# Patient Record
Sex: Female | Born: 1975 | Race: White | Hispanic: No | Marital: Married | Smoking: Former smoker
Health system: Southern US, Community
[De-identification: ages and names within clinical notes are randomized; demographics above are authoritative.]

## PROBLEM LIST (undated history)

## (undated) DIAGNOSIS — D869 Sarcoidosis, unspecified: Secondary | ICD-10-CM

## (undated) HISTORY — DX: Sarcoidosis, unspecified: D86.9

## (undated) HISTORY — PX: CHOLECYSTECTOMY: SHX55

---
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
## 1998-05-30 ENCOUNTER — Emergency Department (HOSPITAL_COMMUNITY): Admission: EM | Admit: 1998-05-30 | Discharge: 1998-05-30 | Payer: Self-pay | Admitting: Emergency Medicine

## 2004-05-08 ENCOUNTER — Emergency Department (HOSPITAL_COMMUNITY): Admission: EM | Admit: 2004-05-08 | Discharge: 2004-05-08 | Payer: Self-pay | Admitting: Emergency Medicine

## 2006-09-09 ENCOUNTER — Emergency Department (HOSPITAL_COMMUNITY): Admission: EM | Admit: 2006-09-09 | Discharge: 2006-09-09 | Payer: Self-pay | Admitting: Emergency Medicine

## 2007-04-09 ENCOUNTER — Emergency Department (HOSPITAL_COMMUNITY): Admission: EM | Admit: 2007-04-09 | Discharge: 2007-04-09 | Payer: Self-pay | Admitting: Emergency Medicine

## 2007-06-21 ENCOUNTER — Emergency Department (HOSPITAL_COMMUNITY): Admission: EM | Admit: 2007-06-21 | Discharge: 2007-06-21 | Payer: Self-pay | Admitting: Emergency Medicine

## 2007-08-30 ENCOUNTER — Emergency Department (HOSPITAL_COMMUNITY): Admission: EM | Admit: 2007-08-30 | Discharge: 2007-08-30 | Payer: Self-pay | Admitting: Emergency Medicine

## 2007-08-30 ENCOUNTER — Emergency Department (HOSPITAL_COMMUNITY): Admission: EM | Admit: 2007-08-30 | Discharge: 2007-08-31 | Payer: Self-pay | Admitting: Emergency Medicine

## 2007-12-10 ENCOUNTER — Emergency Department (HOSPITAL_COMMUNITY): Admission: EM | Admit: 2007-12-10 | Discharge: 2007-12-10 | Payer: Self-pay | Admitting: Emergency Medicine

## 2009-06-12 ENCOUNTER — Emergency Department (HOSPITAL_BASED_OUTPATIENT_CLINIC_OR_DEPARTMENT_OTHER): Admission: EM | Admit: 2009-06-12 | Discharge: 2009-06-12 | Payer: Self-pay | Admitting: Emergency Medicine

## 2009-06-12 ENCOUNTER — Ambulatory Visit: Payer: Self-pay | Admitting: Diagnostic Radiology

## 2010-01-25 ENCOUNTER — Emergency Department (HOSPITAL_BASED_OUTPATIENT_CLINIC_OR_DEPARTMENT_OTHER)
Admission: EM | Admit: 2010-01-25 | Discharge: 2010-01-25 | Payer: Self-pay | Source: Home / Self Care | Admitting: Emergency Medicine

## 2010-04-11 ENCOUNTER — Emergency Department (HOSPITAL_BASED_OUTPATIENT_CLINIC_OR_DEPARTMENT_OTHER)
Admission: EM | Admit: 2010-04-11 | Discharge: 2010-04-11 | Disposition: A | Discharge status: Home / Self Care | Payer: Medicaid Other | Attending: Emergency Medicine | Admitting: Emergency Medicine

## 2010-04-11 ENCOUNTER — Emergency Department (INDEPENDENT_AMBULATORY_CARE_PROVIDER_SITE_OTHER): Payer: Medicaid Other

## 2010-04-11 DIAGNOSIS — B9789 Other viral agents as the cause of diseases classified elsewhere: Secondary | ICD-10-CM | POA: Insufficient documentation

## 2010-04-11 DIAGNOSIS — R509 Fever, unspecified: Secondary | ICD-10-CM

## 2010-04-11 DIAGNOSIS — R059 Cough, unspecified: Secondary | ICD-10-CM | POA: Insufficient documentation

## 2010-04-11 DIAGNOSIS — F17200 Nicotine dependence, unspecified, uncomplicated: Secondary | ICD-10-CM | POA: Insufficient documentation

## 2010-04-11 DIAGNOSIS — R05 Cough: Secondary | ICD-10-CM | POA: Insufficient documentation

## 2010-04-16 LAB — URINALYSIS, ROUTINE W REFLEX MICROSCOPIC
Bilirubin Urine: NEGATIVE
Glucose, UA: NEGATIVE mg/dL
Ketones, ur: NEGATIVE mg/dL
Protein, ur: NEGATIVE mg/dL
Urobilinogen, UA: 0.2 mg/dL (ref 0.0–1.0)
pH: 6.5 (ref 5.0–8.0)

## 2010-05-07 ENCOUNTER — Emergency Department (HOSPITAL_BASED_OUTPATIENT_CLINIC_OR_DEPARTMENT_OTHER)
Admission: EM | Admit: 2010-05-07 | Discharge: 2010-05-07 | Disposition: A | Discharge status: Home / Self Care | Payer: Medicaid Other | Attending: Emergency Medicine | Admitting: Emergency Medicine

## 2010-05-07 DIAGNOSIS — L03317 Cellulitis of buttock: Secondary | ICD-10-CM | POA: Insufficient documentation

## 2010-05-07 DIAGNOSIS — L0231 Cutaneous abscess of buttock: Secondary | ICD-10-CM | POA: Insufficient documentation

## 2010-06-13 ENCOUNTER — Emergency Department (INDEPENDENT_AMBULATORY_CARE_PROVIDER_SITE_OTHER): Payer: Medicaid Other

## 2010-06-13 ENCOUNTER — Emergency Department (HOSPITAL_BASED_OUTPATIENT_CLINIC_OR_DEPARTMENT_OTHER)
Admission: EM | Admit: 2010-06-13 | Discharge: 2010-06-13 | Disposition: A | Discharge status: Home / Self Care | Payer: Medicaid Other | Attending: Emergency Medicine | Admitting: Emergency Medicine

## 2010-06-13 DIAGNOSIS — M25569 Pain in unspecified knee: Secondary | ICD-10-CM | POA: Insufficient documentation

## 2010-10-31 LAB — CBC
MCHC: 34
MCV: 85.7
Platelets: 257
RBC: 4.93
RDW: 13.6

## 2010-10-31 LAB — DIFFERENTIAL
Basophils Relative: 1
Eosinophils Absolute: 0.1
Eosinophils Relative: 2
Lymphs Abs: 1.8
Monocytes Absolute: 0.6
Monocytes Relative: 9
Neutro Abs: 3.9

## 2010-10-31 LAB — COMPREHENSIVE METABOLIC PANEL
CO2: 26
Chloride: 104
Creatinine, Ser: 0.81
GFR calc Af Amer: >60
GFR calc non Af Amer: >60
Potassium: 4.4
Sodium: 140
Total Bilirubin: 0.7
Total Protein: 7.3

## 2010-10-31 LAB — POCT CARDIAC MARKERS: Troponin i, poc: <0.05

## 2010-11-02 LAB — DIFFERENTIAL
Basophils Absolute: 0.2 — ABNORMAL HIGH
Eosinophils Relative: 2
Lymphocytes Relative: 31
Lymphs Abs: 3.8
Monocytes Relative: 10

## 2010-11-02 LAB — URINALYSIS, ROUTINE W REFLEX MICROSCOPIC
Glucose, UA: NEGATIVE
Nitrite: NEGATIVE
Urobilinogen, UA: 0.2
pH: 5.5

## 2010-11-02 LAB — CBC
HCT: 39.5
Hemoglobin: 13.6
MCHC: 34.6
MCV: 84.8
RBC: 4.65
WBC: 12.1 — ABNORMAL HIGH

## 2010-11-02 LAB — POCT PREGNANCY, URINE
Operator id: 261601
Preg Test, Ur: NEGATIVE

## 2010-11-02 LAB — COMPREHENSIVE METABOLIC PANEL
ALT: 21
Albumin: 3.5
Alkaline Phosphatase: 62
CO2: 25
Creatinine, Ser: 0.77
GFR calc non Af Amer: >60
Glucose, Bld: 90
Sodium: 139
Total Protein: 6.3

## 2010-11-02 LAB — WET PREP, GENITAL: Clue Cells Wet Prep HPF POC: NONE SEEN

## 2010-11-02 LAB — GC/CHLAMYDIA PROBE AMP, GENITAL: Chlamydia, DNA Probe: NEGATIVE

## 2010-11-19 LAB — URINALYSIS, ROUTINE W REFLEX MICROSCOPIC
Bilirubin Urine: NEGATIVE
Glucose, UA: NEGATIVE
Hgb urine dipstick: NEGATIVE
Nitrite: NEGATIVE
Protein, ur: NEGATIVE
Specific Gravity, Urine: 1.017
pH: 6.5

## 2010-11-19 LAB — I-STAT 8, (EC8 V) (CONVERTED LAB)
Bicarbonate: 26.4 — ABNORMAL HIGH
HCT: 43
Hemoglobin: 14.6
Operator id: 146091
Sodium: 138
TCO2: 28
pCO2, Ven: 51.4 — ABNORMAL HIGH

## 2010-11-19 LAB — WET PREP, GENITAL
Trich, Wet Prep: NONE SEEN
Yeast Wet Prep HPF POC: NONE SEEN

## 2010-11-19 LAB — CBC
HCT: 39.9
Platelets: 229
RDW: 12.7
WBC: 6.2

## 2010-11-19 LAB — DIFFERENTIAL
Basophils Absolute: 0
Lymphocytes Relative: 28
Lymphs Abs: 1.7
Neutro Abs: 3.7
Neutrophils Relative %: 59

## 2010-11-19 LAB — RPR: RPR Ser Ql: NONREACTIVE

## 2010-11-19 LAB — POCT I-STAT CREATININE: Creatinine, Ser: 0.9

## 2010-11-19 LAB — GC/CHLAMYDIA PROBE AMP, GENITAL
Chlamydia, DNA Probe: NEGATIVE
GC Probe Amp, Genital: NEGATIVE

## 2011-04-24 ENCOUNTER — Emergency Department (HOSPITAL_BASED_OUTPATIENT_CLINIC_OR_DEPARTMENT_OTHER)
Admission: EM | Admit: 2011-04-24 | Discharge: 2011-04-24 | Disposition: A | Discharge status: Home / Self Care | Payer: Self-pay | Attending: Emergency Medicine | Admitting: Emergency Medicine

## 2011-04-24 ENCOUNTER — Encounter (HOSPITAL_BASED_OUTPATIENT_CLINIC_OR_DEPARTMENT_OTHER): Payer: Self-pay | Admitting: Student

## 2011-04-24 ENCOUNTER — Emergency Department (INDEPENDENT_AMBULATORY_CARE_PROVIDER_SITE_OTHER): Payer: Self-pay

## 2011-04-24 DIAGNOSIS — R079 Chest pain, unspecified: Secondary | ICD-10-CM | POA: Insufficient documentation

## 2011-04-24 DIAGNOSIS — R0602 Shortness of breath: Secondary | ICD-10-CM | POA: Insufficient documentation

## 2011-04-24 DIAGNOSIS — R05 Cough: Secondary | ICD-10-CM | POA: Insufficient documentation

## 2011-04-24 DIAGNOSIS — R059 Cough, unspecified: Secondary | ICD-10-CM | POA: Insufficient documentation

## 2011-04-24 MED ORDER — HYDROCOD POLST-CHLORPHEN POLST 10-8 MG/5ML PO LQCR: 5.0000 mL | Freq: Two times a day (BID) | ORAL | Status: DC | PRN

## 2011-04-24 NOTE — ED Provider Notes (Signed)
History     CSN: [id number]  Arrival date & time 04/24/11  1655   First MD Initiated Contact with Patient 04/24/11 1741      Chief Complaint  Patient presents with  . Cough    (Consider location/radiation/quality/duration/timing/severity/associated sxs/prior treatment) HPI Comments: Pt states that she has had congestion and that her chest is sore generally from so much coughing  Patient is a 36 y.o. female presenting with cough. The history is provided by the patient. No language interpreter was used.  Cough This is a new problem. The current episode started more than 1 week ago. The problem occurs constantly. The problem has not changed since onset.The cough is non-productive. There has been no fever. Pertinent negatives include no chest pain, no ear congestion, no headaches, no myalgias, no shortness of breath and no wheezing. She has tried decongestants and cough syrup for the symptoms. The treatment provided mild relief. She is not a smoker.    No past medical history on file.  Past Surgical History  Procedure Date  . Cholecystectomy     No family history on file.  History  Substance Use Topics  . Smoking status: Never Smoker   . Smokeless tobacco: Not on file  . Alcohol Use: No    OB History    Grav Para Term Preterm Abortions TAB SAB Ect Mult Living                  Review of Systems  Respiratory: Positive for cough. Negative for shortness of breath and wheezing.   Cardiovascular: Negative for chest pain.  Musculoskeletal: Negative for myalgias.  Neurological: Negative for headaches.  All other systems reviewed and are negative.    Allergies  Review of patient's allergies indicates no known allergies.  Home Medications  No current outpatient prescriptions on file.  BP 113/78  Pulse 90  Temp(Src) 98.4 F (36.9 C) (Oral)  Resp 20  Wt 185 lb (83.915 kg)  SpO2 100%  LMP 04/01/2011  Physical Exam  Nursing note and vitals reviewed. Constitutional:  She is oriented to person, place, and time. She appears well-developed and well-nourished.  HENT:  Right Ear: External ear normal.  Left Ear: External ear normal.  Nose: Nose normal.  Mouth/Throat: Oropharynx is clear and moist.  Eyes: Conjunctivae and EOM are normal.  Cardiovascular: Normal rate and regular rhythm.   Pulmonary/Chest: Effort normal. She has rales.  Musculoskeletal: Normal range of motion.  Neurological: She is alert and oriented to person, place, and time.  Skin: Skin is dry.  Psychiatric: She has a normal mood and affect.    ED Course  Procedures (including critical care time)  Labs Reviewed - No data to display Dg Chest 2 View  04/24/2011  *RADIOLOGY REPORT*  Clinical Data: Cough, cold symptoms, chest pain, shortness of breath, former smoker  CHEST - 2 VIEW  Comparison: 04/11/2010  Findings: Normal heart size, mediastinal contours, and pulmonary vascularity. Lungs clear. No pleural effusion or pneumothorax. No acute osseous findings.  IMPRESSION: No acute abnormalities.  Original Report Authenticated By: Lollie Marrow, M.D.     1. Cough       MDM  No acute findings:will treat pt symptomatically        Teressa Lower, NP 04/24/11 1824

## 2011-04-24 NOTE — ED Notes (Addendum)
Pt in with c/o "lung pain" and dry cough since 07 mar.

## 2011-04-24 NOTE — Discharge Instructions (Signed)
Cough, Adult  A cough is a reflex. It helps you clear your throat and airways. A cough can help heal your body. A cough can last 2 or 3 weeks (acute) or may last more than 8 weeks (chronic). Some common causes of a cough can include an infection, allergy, or a cold. HOME CARE  Only take medicine as told by your doctor.   If given, take your medicines (antibiotics) as told. Finish them even if you start to feel better.   Use a cold steam vaporizer or humidier in your home. This can help loosen thick spit (secretions).   Sleep so you are almost sitting up (semi-upright). Use pillows to do this. This helps reduce coughing.   Rest as needed.   Stop smoking if you smoke.  GET HELP RIGHT AWAY IF:  You have yellowish-white fluid (pus) in your thick spit.   Your cough gets worse.   Your medicine does not reduce coughing, and you are losing sleep.   You cough up blood.   You have trouble breathing.   Your pain gets worse and medicine does not help.   You have a fever.  MAKE SURE YOU:   Understand these instructions.   Will watch your condition.   Will get help right away if you are not doing well or get worse.  Document Released: 10/04/2010 Document Revised: 01/10/2011 Document Reviewed: 10/04/2010 [employer] Patient Information 2012 [employer]. 

## 2011-04-25 NOTE — ED Provider Notes (Signed)
Medical screening examination/treatment/procedure(s) were performed by non-physician practitioner and as supervising physician I was immediately available for consultation/collaboration.   Gwyneth Sprout, MD 04/25/11 1544

## 2011-05-14 ENCOUNTER — Encounter: Payer: Self-pay | Admitting: *Deleted

## 2011-11-26 ENCOUNTER — Emergency Department (HOSPITAL_BASED_OUTPATIENT_CLINIC_OR_DEPARTMENT_OTHER): Payer: Self-pay

## 2011-11-26 ENCOUNTER — Emergency Department (HOSPITAL_BASED_OUTPATIENT_CLINIC_OR_DEPARTMENT_OTHER)
Admission: EM | Admit: 2011-11-26 | Discharge: 2011-11-26 | Disposition: A | Discharge status: Home / Self Care | Payer: Self-pay | Attending: Emergency Medicine | Admitting: Emergency Medicine

## 2011-11-26 ENCOUNTER — Encounter (HOSPITAL_BASED_OUTPATIENT_CLINIC_OR_DEPARTMENT_OTHER): Payer: Self-pay | Admitting: *Deleted

## 2011-11-26 ENCOUNTER — Telehealth (HOSPITAL_BASED_OUTPATIENT_CLINIC_OR_DEPARTMENT_OTHER): Payer: Self-pay | Admitting: *Deleted

## 2011-11-26 DIAGNOSIS — R51 Headache: Secondary | ICD-10-CM | POA: Insufficient documentation

## 2011-11-26 DIAGNOSIS — R071 Chest pain on breathing: Secondary | ICD-10-CM | POA: Insufficient documentation

## 2011-11-26 DIAGNOSIS — J189 Pneumonia, unspecified organism: Secondary | ICD-10-CM | POA: Insufficient documentation

## 2011-11-26 DIAGNOSIS — R079 Chest pain, unspecified: Secondary | ICD-10-CM

## 2011-11-26 DIAGNOSIS — Z87891 Personal history of nicotine dependence: Secondary | ICD-10-CM | POA: Insufficient documentation

## 2011-11-26 DIAGNOSIS — Z79899 Other long term (current) drug therapy: Secondary | ICD-10-CM | POA: Insufficient documentation

## 2011-11-26 LAB — COMPREHENSIVE METABOLIC PANEL
Albumin: 3.9 g/dL (ref 3.5–5.2)
Alkaline Phosphatase: 79 U/L (ref 39–117)
BUN: 9 mg/dL (ref 6–23)
Chloride: 103 mEq/L (ref 96–112)
Creatinine, Ser: 0.8 mg/dL (ref 0.50–1.10)
GFR calc Af Amer: >90 mL/min (ref >90)
Glucose, Bld: 84 mg/dL (ref 70–99)
Potassium: 3.3 mEq/L — ABNORMAL LOW (ref 3.5–5.1)
Total Bilirubin: 0.3 mg/dL (ref 0.3–1.2)
Total Protein: 7.6 g/dL (ref 6.0–8.3)

## 2011-11-26 LAB — CBC
HCT: 36.8 % (ref 36.0–46.0)
Hemoglobin: 12.2 g/dL (ref 12.0–15.0)
MCHC: 33.2 g/dL (ref 30.0–36.0)
MCV: 79.1 fL (ref 78.0–100.0)
RDW: 14.3 % (ref 11.5–15.5)

## 2011-11-26 LAB — D-DIMER, QUANTITATIVE: D-Dimer, Quant: 0.57 ug/mL-FEU — ABNORMAL HIGH (ref 0.00–0.48)

## 2011-11-26 LAB — TROPONIN I: Troponin I: <0.3 ng/mL (ref <0.30)

## 2011-11-26 MED ORDER — METOCLOPRAMIDE HCL 5 MG/ML IJ SOLN
10.0000 mg | Freq: Once | INTRAMUSCULAR | Status: AC
  Administered 2011-11-26: 10 mg via INTRAVENOUS
  Filled 2011-11-26: qty 2

## 2011-11-26 MED ORDER — SODIUM CHLORIDE 0.9 % IV BOLUS (SEPSIS)
1000.0000 mL | Freq: Once | INTRAVENOUS | Status: AC
  Administered 2011-11-26: 1000 mL via INTRAVENOUS

## 2011-11-26 MED ORDER — LEVOFLOXACIN 500 MG PO TABS: 500.0000 mg | ORAL_TABLET | Freq: Every day | ORAL | Status: DC

## 2011-11-26 MED ORDER — KETOROLAC TROMETHAMINE 30 MG/ML IJ SOLN
30.0000 mg | Freq: Once | INTRAMUSCULAR | Status: AC
  Administered 2011-11-26: 30 mg via INTRAVENOUS
  Filled 2011-11-26: qty 1

## 2011-11-26 MED ORDER — POTASSIUM CHLORIDE CRYS ER 20 MEQ PO TBCR
40.0000 meq | EXTENDED_RELEASE_TABLET | Freq: Once | ORAL | Status: AC
  Administered 2011-11-26: 40 meq via ORAL
  Filled 2011-11-26: qty 2

## 2011-11-26 MED ORDER — IOHEXOL 350 MG/ML SOLN
80.0000 mL | Freq: Once | INTRAVENOUS | Status: AC | PRN
  Administered 2011-11-26: 80 mL via INTRAVENOUS

## 2011-11-26 NOTE — ED Notes (Signed)
Patient transported to CT 

## 2011-11-26 NOTE — ED Notes (Signed)
Pt c/o a burning sensation in her chest that is worse when she breathes x3 days. Pt also c/o HA x1 day. Pt sts she has had a similar episode apprx. 1 year ago and believes she was dx with pleurisy.

## 2011-11-26 NOTE — ED Notes (Signed)
Patient called to state she can not afford the prescription for levaquin.  Requests a different prescription.  Chart reviewed by Dr. Linwood Dibbles.  Orders received for Z pack, take as directed on package, and amoxicillin 500mg  po q 8 hours for 10 days. Prescription called to [facility], [place] @ [phone].

## 2011-11-26 NOTE — ED Provider Notes (Signed)
History     CSN: [id number]  Arrival date & time 11/26/11  0272   First MD Initiated Contact with Patient 11/26/11 0701      Chief Complaint  Patient presents with  . Chest Pain  . Headache    (Consider location/radiation/quality/duration/timing/severity/associated sxs/prior treatment) The history is provided by the patient.  Joanne Martin is a 36 y.o. female history of cholecystectomy here with pleuritic chest pain. She noticed pain when she takes a deep breath as well as burning sensation in her chest. She notes shortness of breath as well. No recent travels and no leg swelling and no history of DVT or PE. She not oral contraceptive currently. She has episode a year ago and she was diagnosed with pleurisy. She notes she has occasional frontal headaches for the last day but denies any neck pain or fevers or weakness.    Past Medical History  Diagnosis Date  . Chest pain     Past Surgical History  Procedure Date  . Cholecystectomy     Family History  Problem Relation Age of Onset  . Heart attack      History  Substance Use Topics  . Smoking status: Former Smoker    Quit date: 02/04/2005  . Smokeless tobacco: Not on file  . Alcohol Use: No    OB History    Grav Para Term Preterm Abortions TAB SAB Ect Mult Living                  Review of Systems  Respiratory: Positive for shortness of breath.   Neurological: Positive for headaches.  All other systems reviewed and are negative.    Allergies  Review of patient's allergies indicates no known allergies.  Home Medications   Current Outpatient Rx  Name Route Sig Dispense Refill  . HYDROCOD POLST-CPM POLST ER 10-8 MG/5ML PO LQCR Oral Take 5 mLs by mouth every 12 (twelve) hours as needed. 140 mL 0  . IBUPROFEN 200 MG PO TABS Oral Take 400 mg by mouth every 6 (six) hours as needed. Patient used this medication for pain.    Marland Kitchen LEVOFLOXACIN 500 MG PO TABS Oral Take 1 tablet (500 mg total) by mouth daily. 7 tablet  0  . LYSINE 500 MG PO CAPS Oral Take 1 capsule by mouth 3 (three) times daily.    Marland Kitchen NASAL SPRAY NA Nasal Place 1 puff into the nose daily as needed. Patient administered 1 puff in each nostril for congestion.    . TYLENOL COLD/FLU SEVERE PO Oral Take 2 tablets by mouth daily as needed. Patient used this medication for cold symptoms.    . NYQUIL PO Oral Take 5 mLs by mouth daily as needed. Patient used this medication for cold.      BP 115/77  Pulse 71  Temp 97.8 F (36.6 C) (Oral)  Resp 18  Ht 5\' 7"  (1.702 m)  Wt 200 lb (90.719 kg)  BMI 31.32 kg/m2  SpO2 100%  LMP 11/25/2011  Physical Exam  Nursing note and vitals reviewed. Constitutional: She is oriented to person, place, and time. She appears well-developed and well-nourished.       Slightly anxious   HENT:  Head: Normocephalic.  Mouth/Throat: Oropharynx is clear and moist.  Eyes: Conjunctivae normal are normal. Pupils are equal, round, and reactive to light.  Neck: Normal range of motion. Neck supple.  Cardiovascular: Normal rate, regular rhythm and normal heart sounds.   Pulmonary/Chest: Effort normal and breath sounds normal. No respiratory  distress. She has no wheezes. She has no rales. She exhibits no tenderness.  Abdominal: Soft. Bowel sounds are normal. She exhibits no distension. There is no tenderness.  Musculoskeletal: Normal range of motion. She exhibits no edema and no tenderness.  Neurological: She is alert and oriented to person, place, and time.  Skin: Skin is warm and dry.  Psychiatric: She has a normal mood and affect. Her behavior is normal. Judgment and thought content normal.    ED Course  Procedures (including critical care time)  Labs Reviewed  COMPREHENSIVE METABOLIC PANEL - Abnormal; Notable for the following:    Potassium 3.3 (*)     All other components within normal limits  D-DIMER, QUANTITATIVE - Abnormal; Notable for the following:    D-Dimer, Quant 0.57 (*)     All other components within  normal limits  CBC  TROPONIN I   Dg Chest 2 View  11/26/2011  *RADIOLOGY REPORT*  Clinical Data: Chest pain.  CHEST - 2 VIEW  Comparison: April 24, 2011.  Findings: Cardiomediastinal silhouette appears normal.  No acute pulmonary disease is noted.  Bony thorax is intact.  IMPRESSION: No acute cardiopulmonary abnormality seen.   Original Report Authenticated By: Venita Sheffield., M.D.    Ct Angio Chest Pe W/cm &/or Wo Cm  11/26/2011  *RADIOLOGY REPORT*  Clinical Data: Burning sensation and chest with breathing for 3 days, headache for 1 day, history of pleurisy  CT ANGIOGRAPHY CHEST  Technique:  Multidetector CT imaging of the chest using the standard protocol during bolus administration of intravenous contrast. Multiplanar reconstructed images including MIPs were obtained and reviewed to evaluate the vascular anatomy.  Contrast: 80mL OMNIPAQUE IOHEXOL 350 MG/ML SOLN  Comparison: Chest x-ray 11/26/2011  Findings: The thoracic aorta shows no dissection or dilatation. There are no filling defects in the pulmonary arterial system. There is mediastinal adenopathy particularly in the subcarinal area.  There is also bilateral hilar adenopathy.  There is multi focal bilateral acinar opacification.  Irregularly marginated spiculated upper lobe opacities are seen bilaterally measuring up to about a centimeter each.  There are smaller more well defined nodular opacities in the mid to lower lung zones bilaterally, and there is interstitial thickening along the bilateral perihilar airways.  There is no endobronchial lesions.  There are no pleural effusions.  There are no acute musculoskeletal abnormalities. Scans to the upper abdomen are unremarkable.  IMPRESSION:  1.  No evidence of pulmonary embolism or dissection 2.  Multi focal nodular and acinar opacities, with mediastinal and hilar adenopathy.  The findings are suspicous for pneumonia and atypical organisms should be considered.   Original Report Authenticated  By: Otilio Carpen, M.D.      1. Community acquired pneumonia   2. Chest pain   3. Headache      Date: 11/26/2011  Rate: 70  Rhythm: normal sinus rhythm  QRS Axis: normal  Intervals: normal  ST/T Wave abnormalities: normal  Conduction Disutrbances:none  Narrative Interpretation:   Old EKG Reviewed: unchanged    MDM  Aashritha Strom is a 36 y.o. female here with pleuritic chest pain. Low suspicion for PE so will get d-dimer. Low suspicion of ACS as she has no risk factors. Will get trop x 1.   9:10 AM D dimer elevated. CT angio chest showed no PE, but there may be atypical pneumonia. Will finish a course of levaquin and have her f/u outpatient. Trop neg x 1, low risk for ACS. Headache resolved with reglan. Stable  for d/c.         Richardean Canal, MD 11/26/11 [phone]

## 2012-02-04 ENCOUNTER — Encounter (HOSPITAL_BASED_OUTPATIENT_CLINIC_OR_DEPARTMENT_OTHER): Payer: Self-pay | Admitting: *Deleted

## 2012-02-04 ENCOUNTER — Emergency Department (HOSPITAL_BASED_OUTPATIENT_CLINIC_OR_DEPARTMENT_OTHER)
Admission: EM | Admit: 2012-02-04 | Discharge: 2012-02-04 | Disposition: A | Discharge status: Home / Self Care | Payer: Self-pay | Attending: Emergency Medicine | Admitting: Emergency Medicine

## 2012-02-04 DIAGNOSIS — H6690 Otitis media, unspecified, unspecified ear: Secondary | ICD-10-CM | POA: Insufficient documentation

## 2012-02-04 DIAGNOSIS — J029 Acute pharyngitis, unspecified: Secondary | ICD-10-CM | POA: Insufficient documentation

## 2012-02-04 DIAGNOSIS — Z87891 Personal history of nicotine dependence: Secondary | ICD-10-CM | POA: Insufficient documentation

## 2012-02-04 DIAGNOSIS — R079 Chest pain, unspecified: Secondary | ICD-10-CM | POA: Insufficient documentation

## 2012-02-04 DIAGNOSIS — Z79899 Other long term (current) drug therapy: Secondary | ICD-10-CM | POA: Insufficient documentation

## 2012-02-04 MED ORDER — AMOXICILLIN 500 MG PO CAPS: 500.0000 mg | ORAL_CAPSULE | Freq: Three times a day (TID) | ORAL | Status: DC

## 2012-02-04 MED ORDER — HYDROCODONE-ACETAMINOPHEN 5-325 MG PO TABS: 2.0000 | ORAL_TABLET | ORAL | Status: DC | PRN

## 2012-02-04 NOTE — ED Provider Notes (Signed)
History     CSN: [id number]  Arrival date & time 02/04/12  1414   First MD Initiated Contact with Patient 02/04/12 1531      Chief Complaint  Patient presents with  . Otalgia    (Consider location/radiation/quality/duration/timing/severity/associated sxs/prior treatment) Patient is a 36 y.o. female presenting with ear pain. The history is provided by the patient. No language interpreter was used.  Otalgia This is a new problem. Episode onset: 4 days. There is pain in the right ear. The problem occurs constantly. There has been no fever. The pain is severe. Associated symptoms include sore throat. Her past medical history does not include chronic ear infection.    Past Medical History  Diagnosis Date  . Chest pain     Past Surgical History  Procedure Date  . Cholecystectomy     Family History  Problem Relation Age of Onset  . Heart attack      History  Substance Use Topics  . Smoking status: Former Smoker    Quit date: 02/04/2005  . Smokeless tobacco: Not on file  . Alcohol Use: No    OB History    Grav Para Term Preterm Abortions TAB SAB Ect Mult Living                  Review of Systems  HENT: Positive for ear pain and sore throat.   All other systems reviewed and are negative.    Allergies  Review of patient's allergies indicates no known allergies.  Home Medications   Current Outpatient Rx  Name  Route  Sig  Dispense  Refill  . IBUPROFEN 200 MG PO TABS   Oral   Take 400 mg by mouth every 6 (six) hours as needed. Patient used this medication for pain.         Marland Kitchen LYSINE 500 MG PO CAPS   Oral   Take 1 capsule by mouth 3 (three) times daily.         Marland Kitchen NASAL SPRAY NA   Nasal   Place 1 puff into the nose daily as needed. Patient administered 1 puff in each nostril for congestion.         . TYLENOL COLD/FLU SEVERE PO   Oral   Take 2 tablets by mouth daily as needed. Patient used this medication for cold symptoms.           BP 129/70   Pulse 98  Temp 97.8 F (36.6 C) (Oral)  Resp 16  SpO2 100%  LMP 01/14/2012  Physical Exam  Nursing note and vitals reviewed. Constitutional: She is oriented to person, place, and time. She appears well-developed and well-nourished.  HENT:  Head: Normocephalic.  Mouth/Throat: Oropharynx is clear and moist.       bilat tms erythematous  Right worse than left  Eyes: Conjunctivae normal and EOM are normal. Pupils are equal, round, and reactive to light.  Neck: Normal range of motion.  Cardiovascular: Normal rate and normal heart sounds.   Pulmonary/Chest: Effort normal.  Musculoskeletal: Normal range of motion.  Neurological: She is alert and oriented to person, place, and time. She has normal reflexes.  Skin: Skin is warm.  Psychiatric: She has a normal mood and affect.    ED Course  Procedures (including critical care time)  Labs Reviewed - No data to display No results found.   No diagnosis found.    MDM  Hydrocodone and amoxicillian   Primary care referrals given  Lonia Skinner [place], [place] 02/04/12 [phone]

## 2012-02-04 NOTE — ED Notes (Signed)
Pt with right ear pain and jaw pain sx began four days ago. Denies fever

## 2012-02-05 NOTE — ED Provider Notes (Signed)
History/physical exam/procedure(s) were performed by non-physician practitioner and as supervising physician I was immediately available for consultation/collaboration. I have reviewed all notes and am in agreement with care and plan.   Hilario Quarry, MD 02/05/12 [phone]

## 2013-02-11 ENCOUNTER — Encounter: Payer: Self-pay | Admitting: Cardiology

## 2013-11-16 IMAGING — CR DG CHEST 2V
2 series · 2 of 2 positions shown · non-contrast
Comparison: April 24, 2011.

CLINICAL DATA: Chest pain.

CHEST - 2 VIEW

[w chest pa]
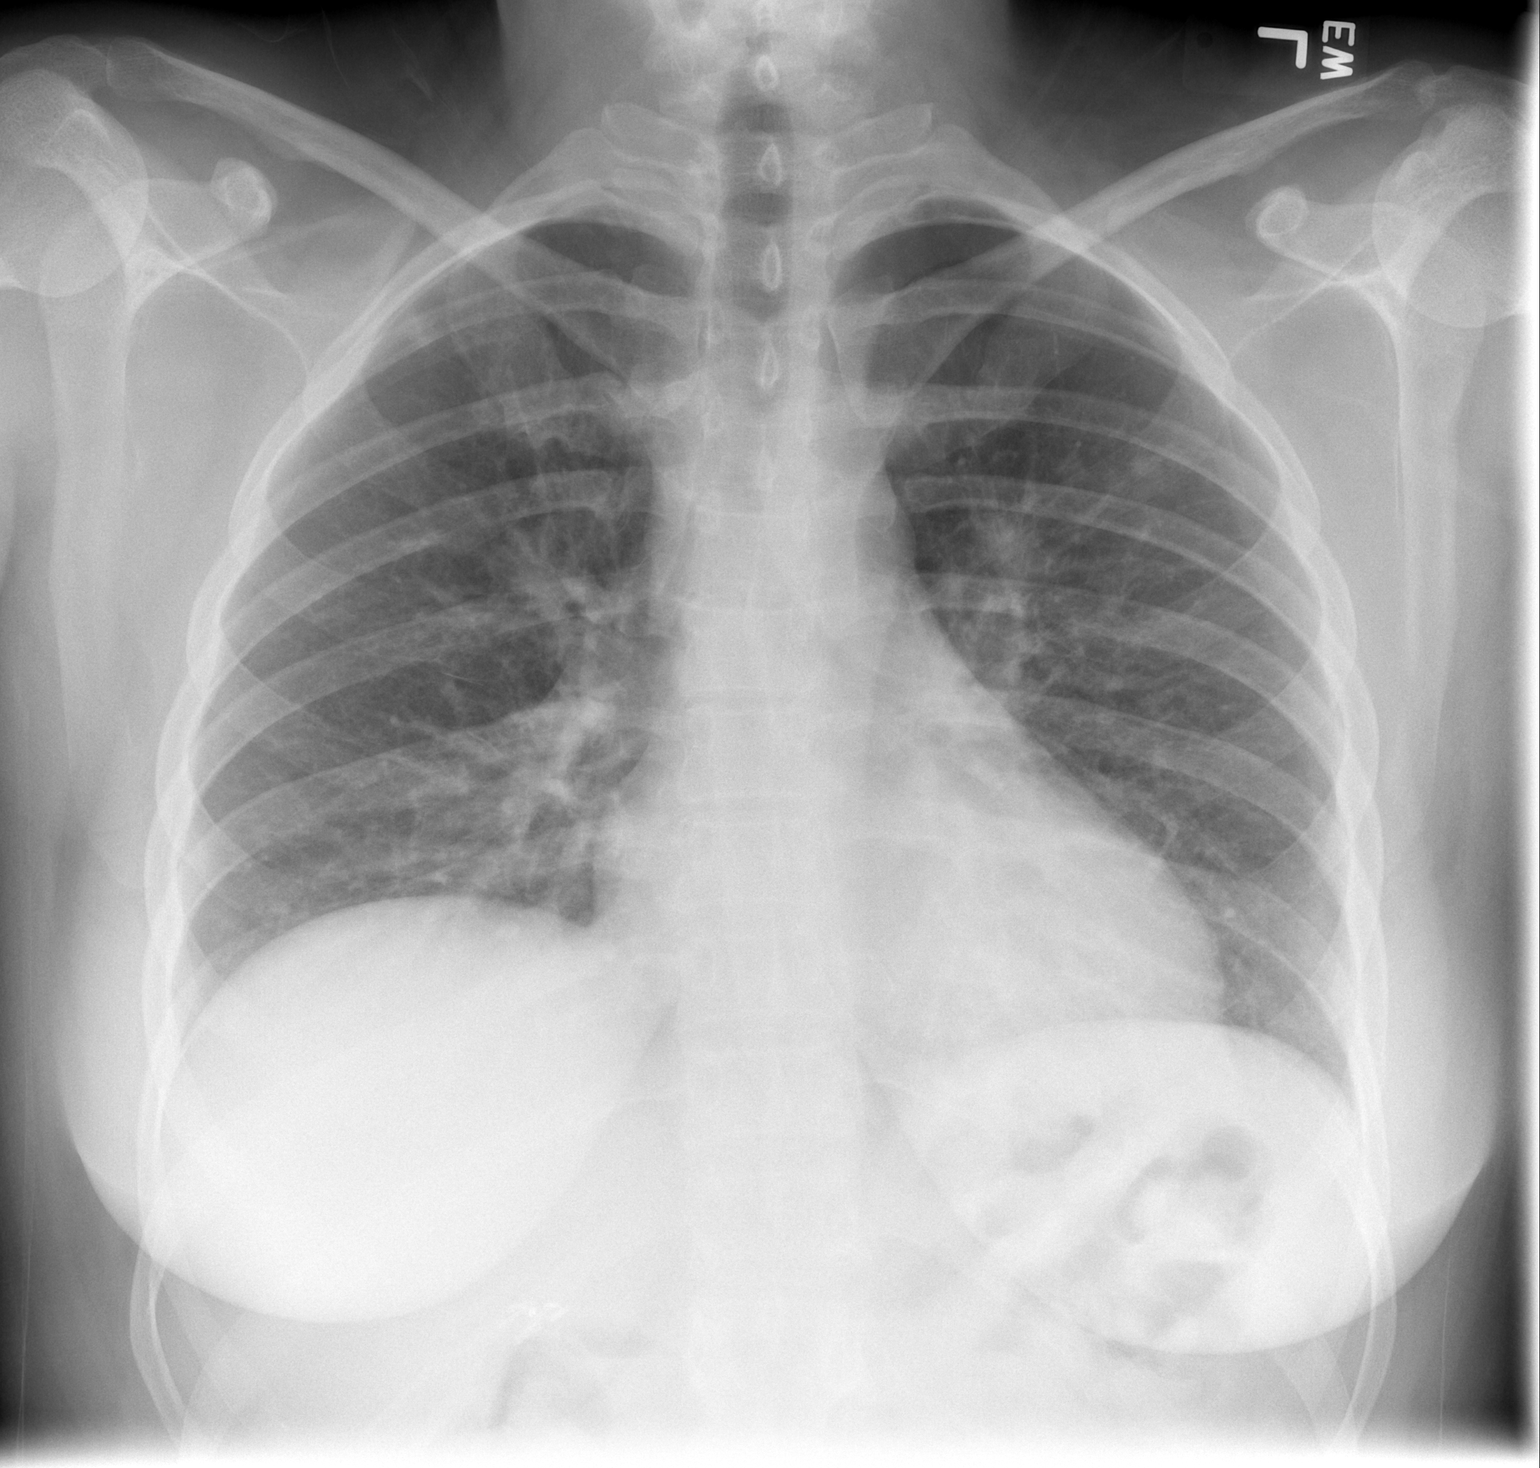

[w chest lat]
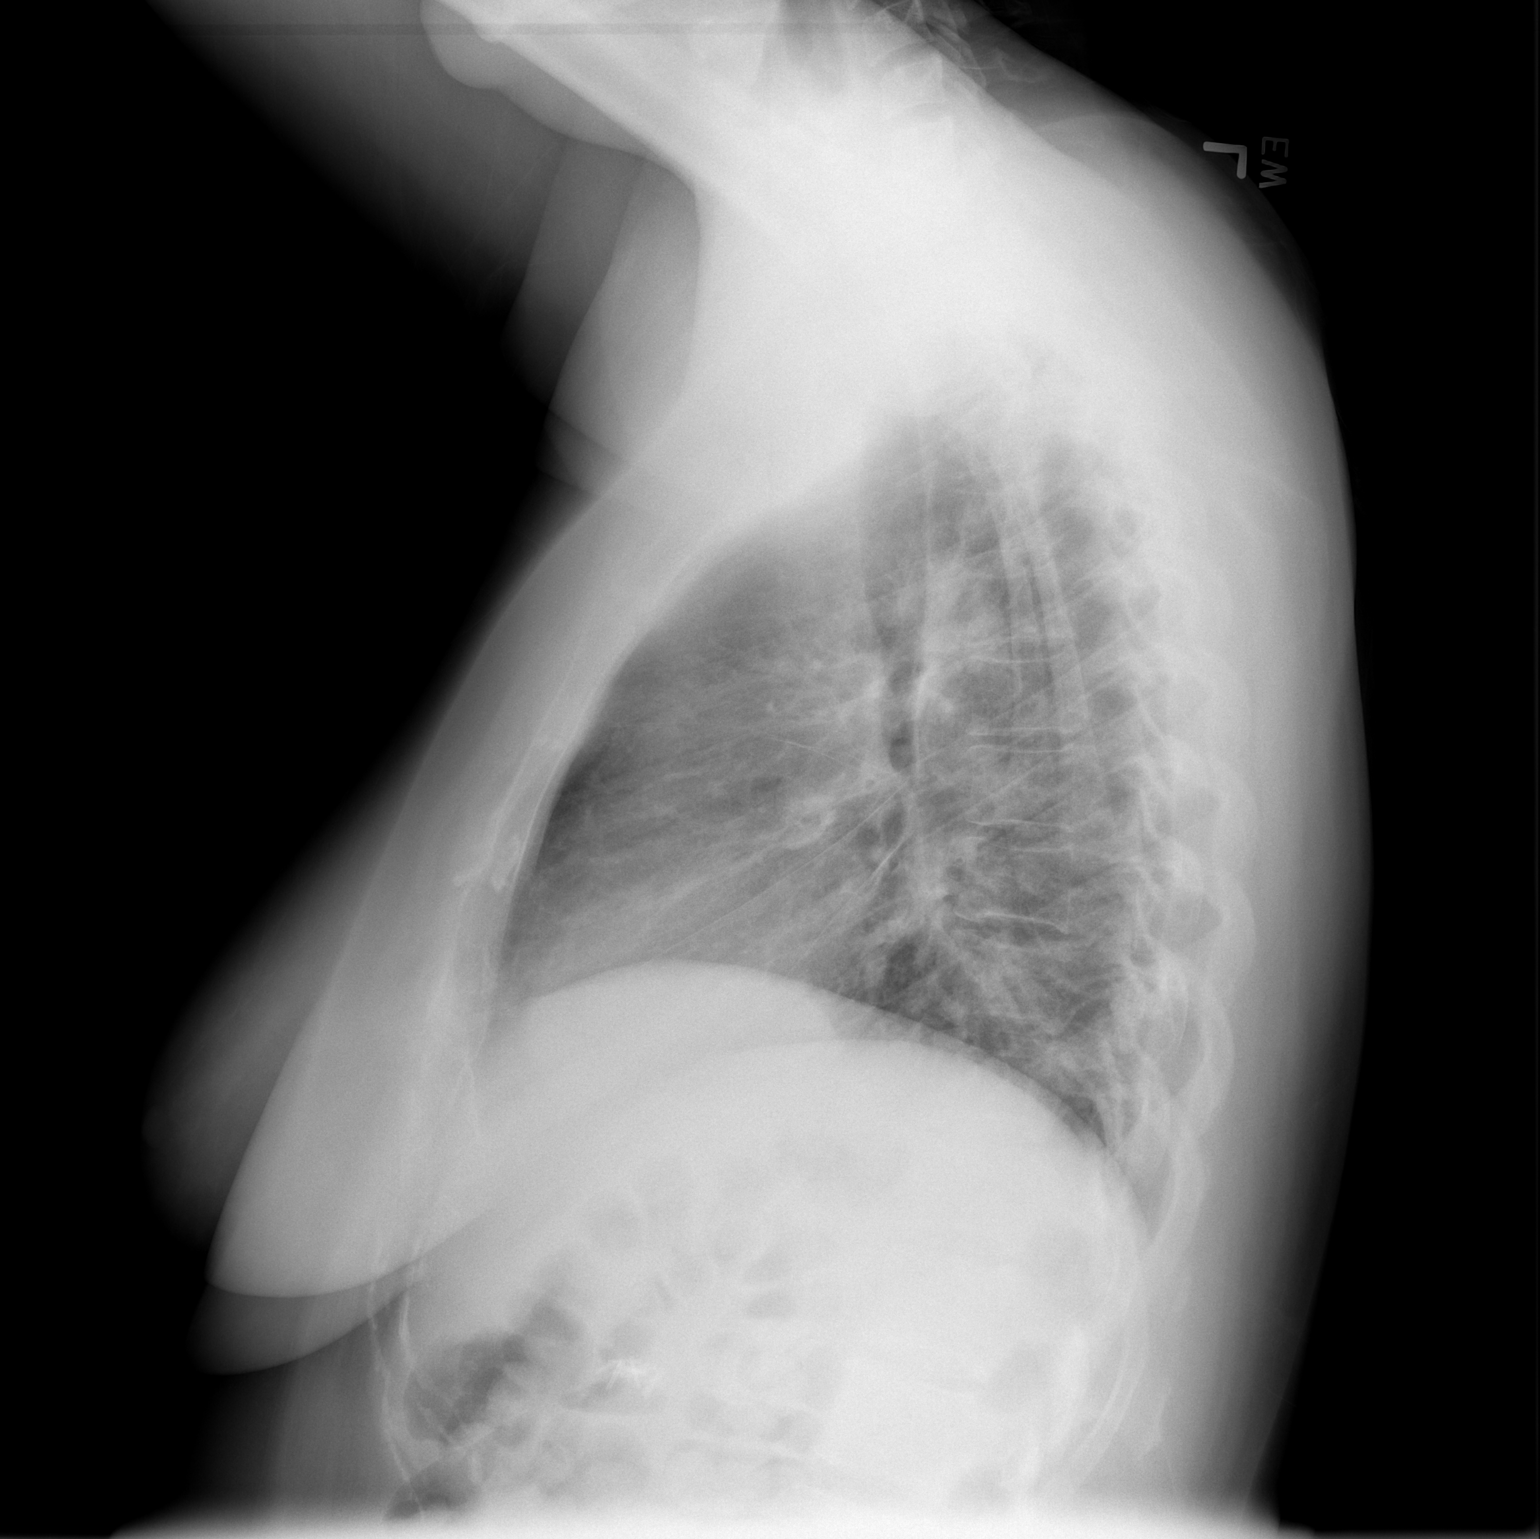

[2 of 2 positions shown; findings below may reference images not displayed]

FINDINGS: Cardiomediastinal silhouette appears normal.  No acute
pulmonary disease is noted.  Bony thorax is intact.
IMPRESSION: No acute cardiopulmonary abnormality seen.

## 2013-11-16 IMAGING — CT CT ANGIO CHEST
2 of 6 series · 19 of 36 positions shown · IV contrast (APPLIED)
Comparison: Chest x-ray 11/26/2011

CLINICAL DATA: Burning sensation and chest with breathing for 3
days, headache for 1 day, history of pleurisy

CT ANGIOGRAPHY CHEST
TECHNIQUE: Multidetector CT imaging of the chest using the
standard protocol during bolus administration of intravenous
contrast. Multiplanar reconstructed images including MIPs were
obtained and reviewed to evaluate the vascular anatomy.
Contrast: 80mL OMNIPAQUE IOHEXOL 350 MG/ML SOLN

[Series 5: pe 1.0 b26f · axial · 0.66mm/px · z∈[+1156,+1366]mm · 18 of 234 slices shown]
[im 12/234  lung]
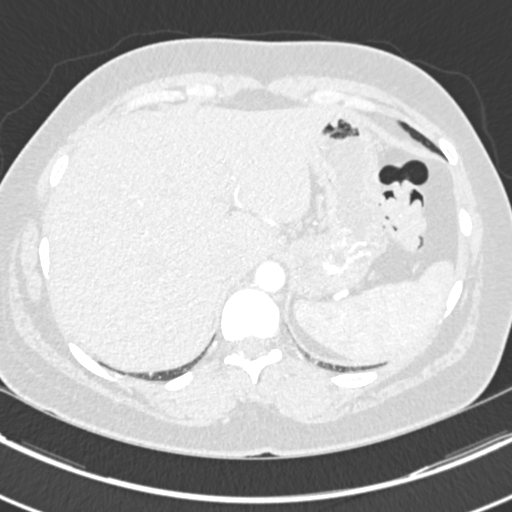
[im 24/234  mediastinal]
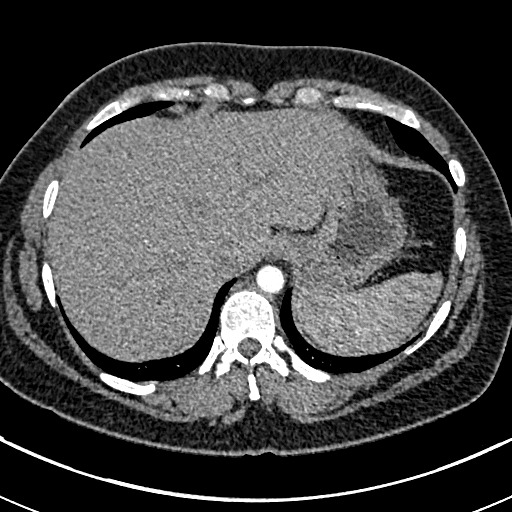
[im 35/234  lung]
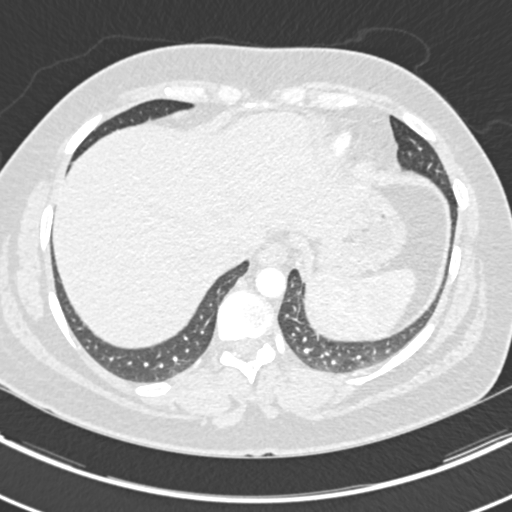
[im 47/234  mediastinal]
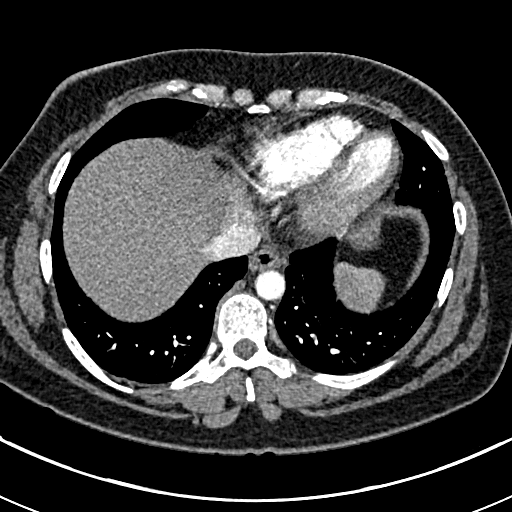
[im 59/234  lung]
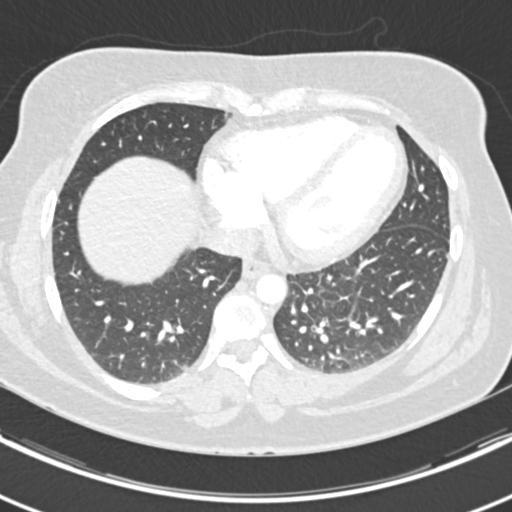
[im 70/234  mediastinal]
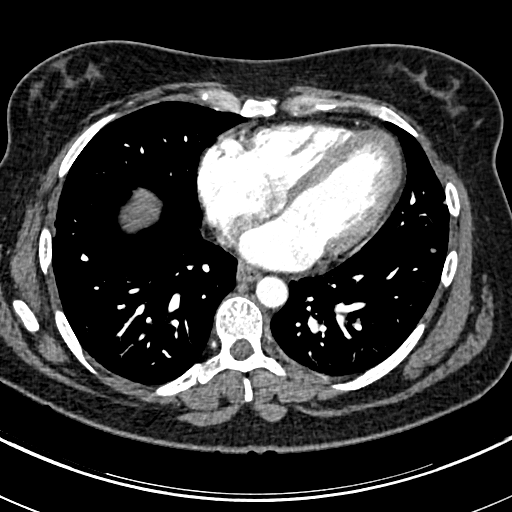
[im 82/234  lung]
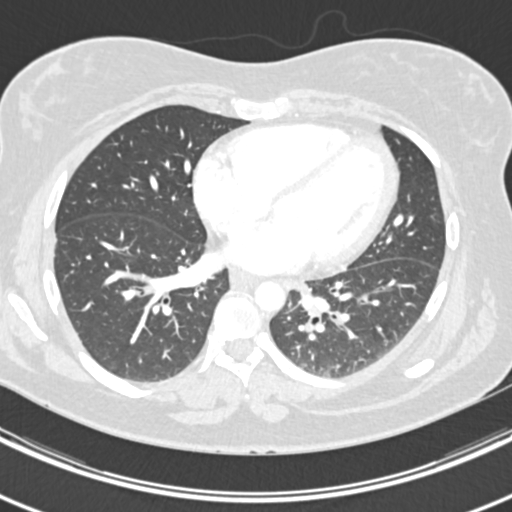
[im 94/234  mediastinal]
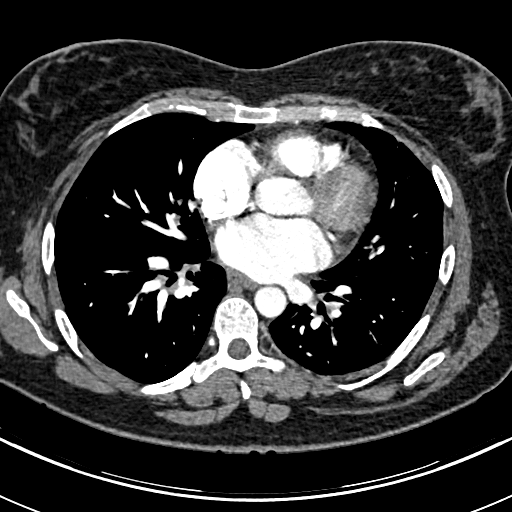
[im 105/234  lung]
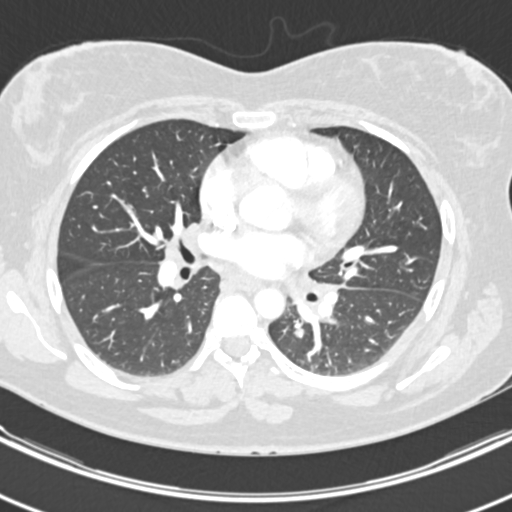
[im 129/234  mediastinal]
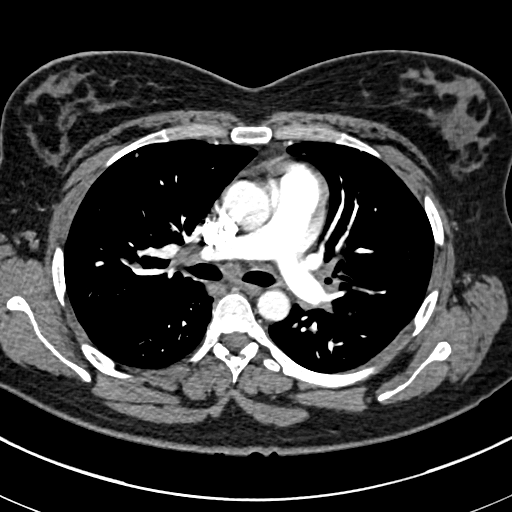
[im 140/234  lung]
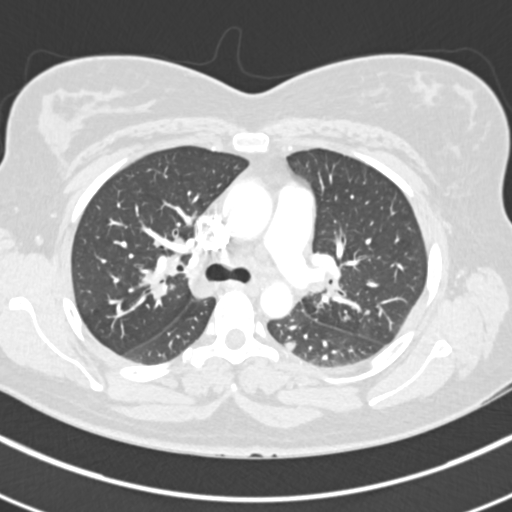
[im 152/234  mediastinal]
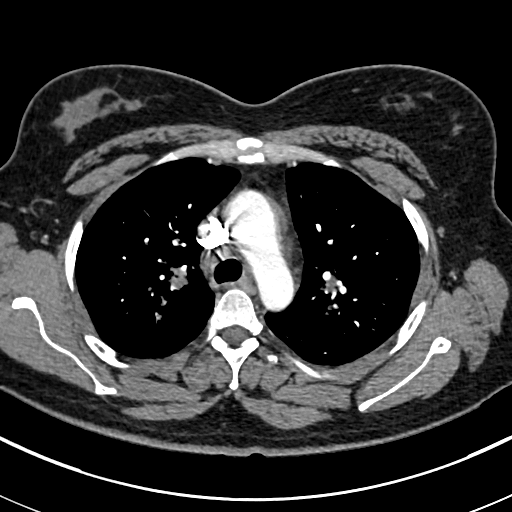
[im 164/234  lung]
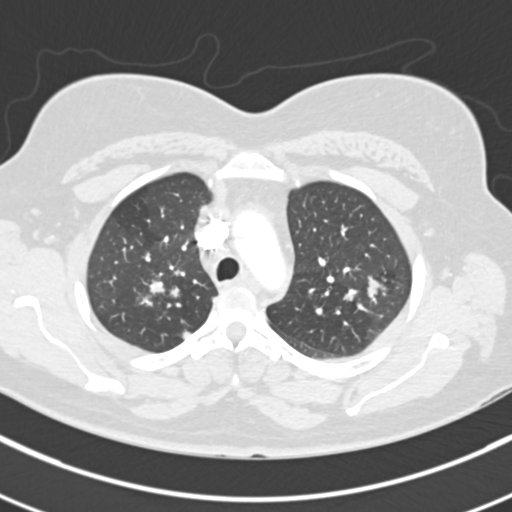
[im 175/234  mediastinal]
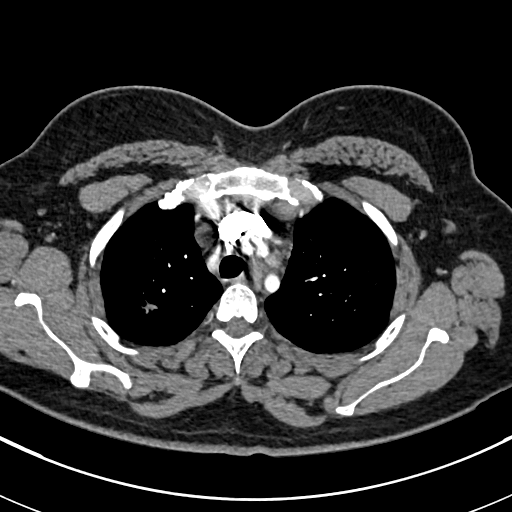
[im 187/234  lung]
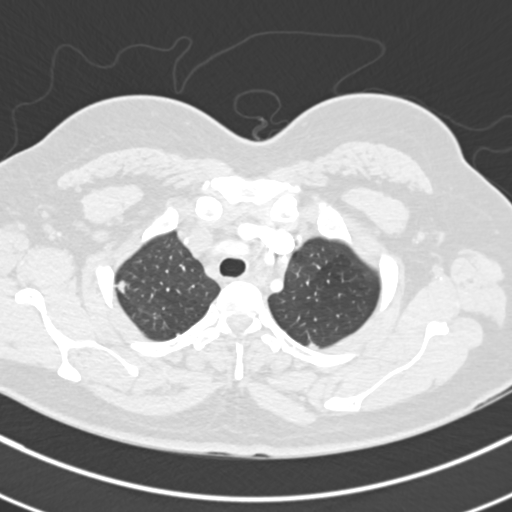
[im 199/234  mediastinal]
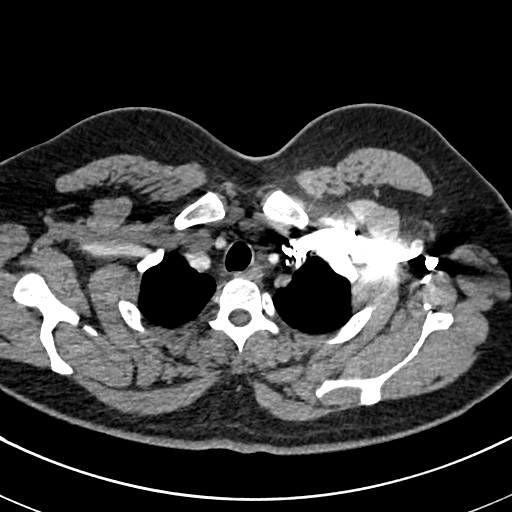
[im 210/234  lung]
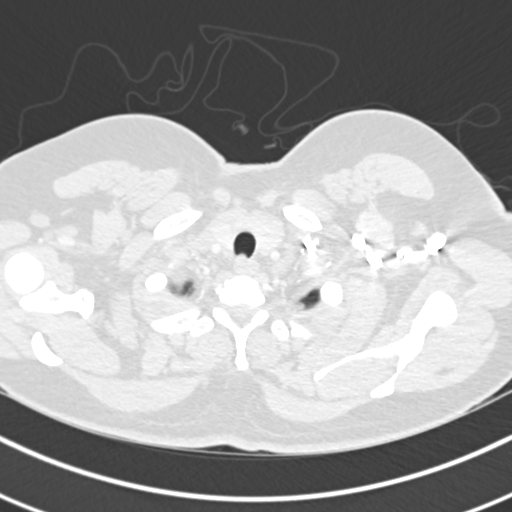
[im 222/234  mediastinal]
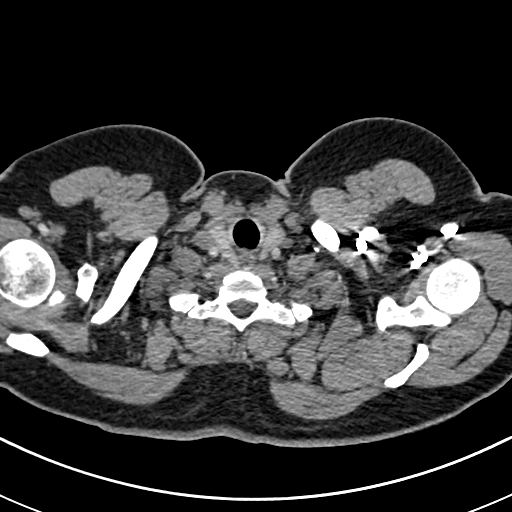

[Series 8: pe 2.0 coronal · coronal · 0.54mm/px · 1 of 138 slices shown]
[im 69/138  mediastinal]
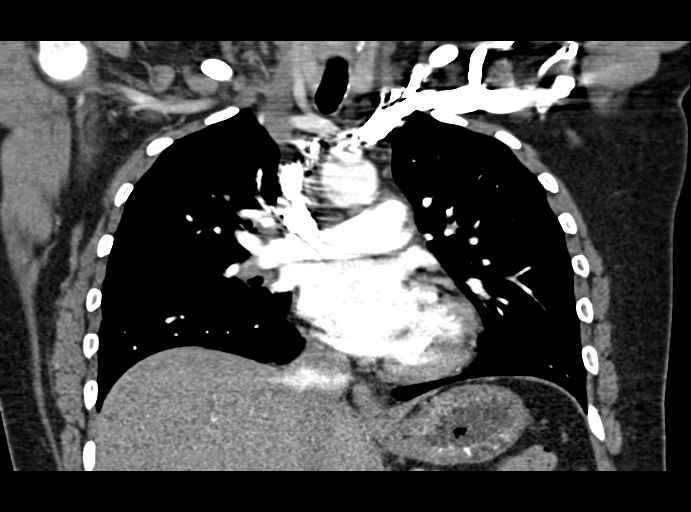

[19 of 36 positions shown; findings below may reference images not displayed]

FINDINGS: The thoracic aorta shows no dissection or dilatation.
There are no filling defects in the pulmonary arterial system.
There is mediastinal adenopathy particularly in the subcarinal
area.  There is also bilateral hilar adenopathy.  There is multi
focal bilateral acinar opacification.  Irregularly marginated
spiculated upper lobe opacities are seen bilaterally measuring up
to about a centimeter each.  There are smaller more well defined
nodular opacities in the mid to lower lung zones bilaterally, and
there is interstitial thickening along the bilateral perihilar
airways.  There is no endobronchial lesions.  There are no pleural
effusions.  There are no acute musculoskeletal abnormalities.
Scans to the upper abdomen are unremarkable.
IMPRESSION: 1.  No evidence of pulmonary embolism or dissection
2.  Multi focal nodular and acinar opacities, with mediastinal and
hilar adenopathy.  The findings are suspicous for pneumonia and
atypical organisms should be considered.

## 2015-04-14 ENCOUNTER — Encounter: Payer: Self-pay | Admitting: Cardiovascular Disease

## 2015-04-14 ENCOUNTER — Ambulatory Visit (INDEPENDENT_AMBULATORY_CARE_PROVIDER_SITE_OTHER): Payer: Commercial Managed Care - PPO | Admitting: Cardiovascular Disease

## 2015-04-14 VITALS — BP 136/84 | HR 73 | Ht 65.0 in | Wt 201.6 lb

## 2015-04-14 DIAGNOSIS — R079 Chest pain, unspecified: Secondary | ICD-10-CM

## 2015-04-14 DIAGNOSIS — Z13220 Encounter for screening for lipoid disorders: Secondary | ICD-10-CM | POA: Diagnosis not present

## 2015-04-14 DIAGNOSIS — D869 Sarcoidosis, unspecified: Secondary | ICD-10-CM

## 2015-04-14 DIAGNOSIS — R0602 Shortness of breath: Secondary | ICD-10-CM

## 2015-04-14 LAB — LIPID PANEL
CHOL/HDL RATIO: 2.7 ratio (ref <=5.0)
CHOLESTEROL: 181 mg/dL (ref 125–200)
HDL: 66 mg/dL (ref >=46)
LDL CALC: 101 mg/dL (ref <130)
TRIGLYCERIDES: 71 mg/dL (ref <150)
VLDL: 14 mg/dL (ref <30)

## 2015-04-14 NOTE — Progress Notes (Signed)
Cardiology Office Note   Date:  04/15/2015   ID:  Joanne Martin, DOB 02/18/1975, MRN [id number]  PCP:  Primus Bravo, NP  Cardiologist:   Madilyn Hook, MD   Chief Complaint  Patient presents with  . New Patient (Initial Visit)    pt states having some sharp and dull pain under left arm that comes and goes  . Shortness of Breath    sometimes  . Edema    no edmea       History of Present Illness: Joanne Martin is a 40 y.o. female with sarcoidosis who presents for an evaluation of chest pain and shortness of breath.  Joanne Martin reports intermittent episodes of pain under her left breast that are both dull and sharp.  The episodes last for a few seconds.  They occur both at rest and with exertion.  She also notes that it is worse when she lays on her left side.  There is no associated shortness of breath, though she does also note dyspnea on exertion.  She denies associated nausea, vomiting or diaphoresis.  She does not get any exercise.  The most strenuous thing she does is move furniture, which does not cause her to have pain.  She has not noted lower extremity edema, orthopnea or PND.  Joanne Martin was diagnosed with sarcoidosis 1.5 years ago.  Three years ago she had a biopsy of a clavicular lymph node.  She was told that it was not malignant but did not receive any additional information.  She saw a pulmonologist 1.5 years ago due to recurrent pneumonia.  He researched the prior lymph node biopsy and found that it showed sarcoidosis.  She has started therapy for sarcoid and her breathing has improved significantly.  She has been afraid that it may be affecting her heart so she has been afraid to exercise or do anything strenuous.  She denies palpitations, lightheadedness or dizziness.    Past Medical History  Diagnosis Date  . Chest pain   . Sarcoidosis [facility])     Past Surgical History  Procedure Laterality Date  . Cholecystectomy       Current Outpatient  Prescriptions  Medication Sig Dispense Refill  . fluticasone (FLONASE) 50 MCG/ACT nasal spray Place into both nostrils as needed for allergies.     No current facility-administered medications for this visit.    Allergies:   Tdap    Social History:  The patient  reports that she quit smoking about 10 years ago. She does not have any smokeless tobacco history on file. She reports that she drinks about 3.0 oz of alcohol per week.   Family History:  The patient's family history includes Alcohol abuse in her father; Cancer in her maternal grandmother; Heart attack in her mother; Heart disease in her maternal grandfather and maternal grandmother; Stroke in her maternal grandfather.    ROS:  Please see the history of present illness.   Otherwise, review of systems are positive for none.   All other systems are reviewed and negative.    PHYSICAL EXAM: VS:  BP 136/84 mmHg  Pulse 73  Ht  (1.651 m)  Wt 91.445 kg (201 lb 9.6 oz)  BMI 33.55 kg/m2 , BMI Body mass index is 33.55 kg/(m^2). GENERAL:  Well appearing HEENT:  Pupils equal round and reactive, fundi not visualized, oral mucosa unremarkable NECK:  No jugular venous distention, waveform within normal limits, carotid upstroke brisk and symmetric, no bruits, no thyromegaly LYMPHATICS:  No  cervical adenopathy LUNGS:  Clear to auscultation bilaterally HEART:  RRR.  PMI not displaced or sustained,S1 and S2 within normal limits, no S3, no S4, no clicks, no rubs, no murmurs ABD:  Flat, positive bowel sounds normal in frequency in pitch, no bruits, no rebound, no guarding, no midline pulsatile mass, no hepatomegaly, no splenomegaly EXT:  2 plus pulses throughout, no edema, no cyanosis no clubbing SKIN:  No rashes no nodules NEURO:  Cranial nerves II through XII grossly intact, motor grossly intact throughout PSYCH:  Cognitively intact, oriented to person place and time    EKG:  EKG is ordered today. The ekg ordered today demonstrates  sinus rhythm.  Rate 73 bpm.   Recent Labs: No results found for requested labs within last 365 days.    Lipid Panel    Component Value Date/Time   CHOL 181 04/14/2015 1047   TRIG 71 04/14/2015 1047   HDL 66 04/14/2015 1047   CHOLHDL 2.7 04/14/2015 1047   VLDL 14 04/14/2015 1047   LDLCALC 101 04/14/2015 1047      Wt Readings from Last 3 Encounters:  04/14/15 91.445 kg (201 lb 9.6 oz)  11/26/11 90.719 kg (200 lb)  04/24/11 83.915 kg (185 lb)      ASSESSMENT AND PLAN:  # Shortness of breath: Joanne Martin reports exertional shortness of breath.  She does not have evidence of heart failure on exam.  Given these symptoms and her history of sarcoidosis, we will order an echo.  # Atypical chest pain: Symptoms are atypical.  However, her mother had a myocardial infarction at age 41.  Therefore, we will obtain an exercise Cardiolite to evaluate for ischemia.  We will also check her fasting lipid panel.      Current medicines are reviewed at length with the patient today.  The patient does not have concerns regarding medicines.  The following changes have been made:  no change  Labs/ tests ordered today include:   Orders Placed This Encounter  Procedures  . Lipid panel  . Myocardial Perfusion Imaging  . EKG 12-Lead  . ECHOCARDIOGRAM COMPLETE     Disposition:   FU with Lashaun Poch C. Duke Salviaandolph, MD, [facility] in 1 years   This note was written with the assistance of speech recognition software.  Please excuse any transcriptional errors.  Signed, Tyeshia Cornforth C. Duke Salviaandolph, MD, [facility]  04/15/2015 4:17 PM    [facility]

## 2015-04-14 NOTE — Patient Instructions (Signed)
Medication Instructions:  Your physician recommends that you continue on your current medications as directed. Please refer to the Current Medication list given to you today.  Labwork: Lipid panel at [facility] on the first floor  Testing/Procedures: Your physician has requested that you have an echocardiogram. Echocardiography is a painless test that uses sound waves to create images of your heart. It provides your doctor with information about the size and shape of your heart and how well your heart's chambers and valves are working. This procedure takes approximately one hour. There are no restrictions for this procedure.  Your physician has requested that you have en exercise stress myoview. For further information please visit [email or web address]. Please follow instruction sheet, as given.  Follow-Up: Your physician wants you to follow-up in: 1 year ov You will receive a reminder letter in the mail two months in advance. If you don't receive a letter, please call our office to schedule the follow-up appointment.  If you need a refill on your cardiac medications before your next appointment, please call your pharmacy.

## 2015-04-15 ENCOUNTER — Encounter: Payer: Self-pay | Admitting: Cardiovascular Disease

## 2015-04-21 ENCOUNTER — Telehealth (HOSPITAL_COMMUNITY): Payer: Self-pay

## 2015-04-21 NOTE — Telephone Encounter (Signed)
Encounter complete. 

## 2015-04-25 NOTE — Telephone Encounter (Signed)
Encounter complete. 

## 2015-04-26 ENCOUNTER — Ambulatory Visit (HOSPITAL_COMMUNITY)
Admission: RE | Admit: 2015-04-26 | Discharge: 2015-04-26 | Disposition: A | Discharge status: Home / Self Care | Payer: Commercial Managed Care - PPO | Source: Ambulatory Visit | Attending: Cardiovascular Disease | Admitting: Cardiovascular Disease

## 2015-04-26 DIAGNOSIS — R0609 Other forms of dyspnea: Secondary | ICD-10-CM | POA: Insufficient documentation

## 2015-04-26 DIAGNOSIS — R0602 Shortness of breath: Secondary | ICD-10-CM | POA: Diagnosis not present

## 2015-04-26 DIAGNOSIS — D869 Sarcoidosis, unspecified: Secondary | ICD-10-CM | POA: Insufficient documentation

## 2015-04-26 DIAGNOSIS — R079 Chest pain, unspecified: Secondary | ICD-10-CM | POA: Diagnosis not present

## 2015-04-26 DIAGNOSIS — Z87891 Personal history of nicotine dependence: Secondary | ICD-10-CM | POA: Diagnosis not present

## 2015-04-26 DIAGNOSIS — R002 Palpitations: Secondary | ICD-10-CM | POA: Insufficient documentation

## 2015-04-26 DIAGNOSIS — Z8249 Family history of ischemic heart disease and other diseases of the circulatory system: Secondary | ICD-10-CM | POA: Insufficient documentation

## 2015-04-26 LAB — MYOCARDIAL PERFUSION IMAGING
CHL CUP MPHR: 179 {beats}/min
CHL CUP NUCLEAR SSS: 6
CSEPHR: 90 %
CSEPPHR: 162 {beats}/min
Estimated workload: 10.1 METS
Exercise duration (min): 8 min
Exercise duration (sec): 20 s
LVDIAVOL: 88 mL (ref 46–106)
LVSYSVOL: 34 mL
NUC STRESS TID: 1.09
RPE: 16
Rest HR: 81 {beats}/min
SDS: 5
SRS: 1

## 2015-04-26 MED ORDER — TECHNETIUM TC 99M SESTAMIBI GENERIC - CARDIOLITE
31.6000 | Freq: Once | INTRAVENOUS | Status: AC | PRN
  Administered 2015-04-26: 31.6 via INTRAVENOUS

## 2015-04-26 MED ORDER — TECHNETIUM TC 99M SESTAMIBI GENERIC - CARDIOLITE
10.2000 | Freq: Once | INTRAVENOUS | Status: AC | PRN
  Administered 2015-04-26: 10.2 via INTRAVENOUS

## 2015-04-27 ENCOUNTER — Telehealth: Payer: Self-pay | Admitting: Cardiovascular Disease

## 2015-04-27 NOTE — Telephone Encounter (Signed)
Follow Up ° ° ° ° °Pt is returning call from earlier. Please call. °

## 2015-04-27 NOTE — Telephone Encounter (Signed)
Advised patient of results.  

## 2015-04-27 NOTE — Telephone Encounter (Signed)
-----   Message from Chilton Siiffany Wisdom, MD sent at 04/26/2015  7:18 PM EDT ----- Normal stress test.

## 2015-05-01 ENCOUNTER — Ambulatory Visit (HOSPITAL_COMMUNITY): Payer: Commercial Managed Care - PPO | Attending: Cardiology

## 2015-05-01 ENCOUNTER — Other Ambulatory Visit: Payer: Self-pay

## 2015-05-01 DIAGNOSIS — I071 Rheumatic tricuspid insufficiency: Secondary | ICD-10-CM | POA: Diagnosis not present

## 2015-05-01 DIAGNOSIS — R079 Chest pain, unspecified: Secondary | ICD-10-CM | POA: Insufficient documentation

## 2015-05-01 DIAGNOSIS — I340 Nonrheumatic mitral (valve) insufficiency: Secondary | ICD-10-CM | POA: Diagnosis not present

## 2015-05-01 DIAGNOSIS — D869 Sarcoidosis, unspecified: Secondary | ICD-10-CM | POA: Insufficient documentation

## 2015-05-02 ENCOUNTER — Telehealth: Payer: Self-pay | Admitting: *Deleted

## 2015-05-02 NOTE — Telephone Encounter (Signed)
-----   Message from Chilton Siiffany Everson, MD sent at 05/02/2015  9:51 AM EDT ----- Normal echo.  There is no evidence of sarcoidosis in her heart.

## 2015-05-02 NOTE — Telephone Encounter (Signed)
Advised patient
